# Patient Record
Sex: Female | Born: 1980 | Race: White | Hispanic: No | Marital: Married | Smoking: Current every day smoker
Health system: Southern US, Community
[De-identification: ages and names within clinical notes are randomized; demographics above are authoritative.]

## PROBLEM LIST (undated history)

## (undated) HISTORY — PX: COSMETIC SURGERY: SHX468

## (undated) HISTORY — PX: ABLATION: SHX5711

## (undated) HISTORY — PX: CHOLECYSTECTOMY: SHX55

---

## 2019-04-21 ENCOUNTER — Other Ambulatory Visit: Payer: Self-pay

## 2019-04-21 DIAGNOSIS — Z20822 Contact with and (suspected) exposure to covid-19: Secondary | ICD-10-CM

## 2019-04-23 LAB — NOVEL CORONAVIRUS, NAA: SARS-CoV-2, NAA: NOT DETECTED

## 2019-08-07 ENCOUNTER — Other Ambulatory Visit: Payer: Self-pay | Admitting: Obstetrics

## 2019-08-07 DIAGNOSIS — R928 Other abnormal and inconclusive findings on diagnostic imaging of breast: Secondary | ICD-10-CM

## 2019-08-14 ENCOUNTER — Ambulatory Visit
Admission: RE | Admit: 2019-08-14 | Discharge: 2019-08-14 | Disposition: A | Discharge status: Home / Self Care | Payer: Self-pay | Source: Ambulatory Visit | Attending: Obstetrics | Admitting: Obstetrics

## 2019-08-14 ENCOUNTER — Other Ambulatory Visit: Payer: Self-pay

## 2019-08-14 ENCOUNTER — Other Ambulatory Visit: Payer: Self-pay | Admitting: Obstetrics

## 2019-08-14 DIAGNOSIS — R928 Other abnormal and inconclusive findings on diagnostic imaging of breast: Secondary | ICD-10-CM

## 2019-08-21 ENCOUNTER — Other Ambulatory Visit: Payer: Self-pay | Admitting: Obstetrics

## 2019-08-21 DIAGNOSIS — N6001 Solitary cyst of right breast: Secondary | ICD-10-CM

## 2020-02-17 ENCOUNTER — Other Ambulatory Visit: Payer: Self-pay | Admitting: Obstetrics

## 2020-02-17 ENCOUNTER — Ambulatory Visit
Admission: RE | Admit: 2020-02-17 | Discharge: 2020-02-17 | Disposition: A | Discharge status: Home / Self Care | Payer: BC Managed Care – PPO | Source: Ambulatory Visit | Attending: Obstetrics | Admitting: Obstetrics

## 2020-02-17 ENCOUNTER — Other Ambulatory Visit: Payer: Self-pay

## 2020-02-17 ENCOUNTER — Ambulatory Visit
Admission: RE | Admit: 2020-02-17 | Discharge: 2020-02-17 | Disposition: A | Discharge status: Home / Self Care | Payer: Self-pay | Source: Ambulatory Visit | Attending: Obstetrics | Admitting: Obstetrics

## 2020-02-17 DIAGNOSIS — N6001 Solitary cyst of right breast: Secondary | ICD-10-CM

## 2020-08-11 ENCOUNTER — Ambulatory Visit (INDEPENDENT_AMBULATORY_CARE_PROVIDER_SITE_OTHER): Payer: BC Managed Care – PPO

## 2020-08-11 ENCOUNTER — Encounter: Payer: Self-pay | Admitting: Emergency Medicine

## 2020-08-11 ENCOUNTER — Ambulatory Visit
Admission: EM | Admit: 2020-08-11 | Discharge: 2020-08-11 | Disposition: A | Discharge status: Home / Self Care | Payer: BC Managed Care – PPO | Attending: Emergency Medicine | Admitting: Emergency Medicine

## 2020-08-11 ENCOUNTER — Other Ambulatory Visit: Payer: Self-pay

## 2020-08-11 DIAGNOSIS — R059 Cough, unspecified: Secondary | ICD-10-CM | POA: Diagnosis not present

## 2020-08-11 DIAGNOSIS — Z720 Tobacco use: Secondary | ICD-10-CM

## 2020-08-11 DIAGNOSIS — R062 Wheezing: Secondary | ICD-10-CM

## 2020-08-11 DIAGNOSIS — T7840XA Allergy, unspecified, initial encounter: Secondary | ICD-10-CM

## 2020-08-11 DIAGNOSIS — J209 Acute bronchitis, unspecified: Secondary | ICD-10-CM

## 2020-08-11 MED ORDER — ALBUTEROL SULFATE HFA 108 (90 BASE) MCG/ACT IN AERS: 1.0000 | INHALATION_SPRAY | Freq: Four times a day (QID) | RESPIRATORY_TRACT | 0 refills | Status: AC | PRN

## 2020-08-11 MED ORDER — BENZONATATE 100 MG PO CAPS: 100.0000 mg | ORAL_CAPSULE | Freq: Three times a day (TID) | ORAL | 0 refills | Status: AC

## 2020-08-11 MED ORDER — PREDNISONE 10 MG (21) PO TBPK: ORAL_TABLET | Freq: Every day | ORAL | 0 refills | Status: AC

## 2020-08-11 NOTE — ED Triage Notes (Signed)
Since Sunday has been sneezing and having seasonal allergies.  Started taking zyrtec yesterday.  Nyquil at night. Now pt has started coughing with no relief from OTC medication.

## 2020-08-11 NOTE — Discharge Instructions (Signed)
Get plenty of rest and push fluids Prescribed tessolone perles as needed for cough Prednisone prescribed.  Take as directed and to completion Use OTC medication as needed for symptomatic relief Follow up with PCP for recheck and/or if symptoms persists Return or go to ER if you have any new or worsening symptoms such as fever, chills, fatigue, shortness of breath, wheezing, chest pain, nausea, changes in bowel or bladder habits, etc..Marland Kitchen

## 2020-08-11 NOTE — ED Provider Notes (Signed)
[facility]   [id number] 08/11/20 Arrival Time: 1510  Cc: COUGH  SUBJECTIVE:  Whitney Petersen is a 40 y.o. female who presents with sneezing, sinus congestion/ pressure, nonproductive cough, and wheezing x 3 days.  Son with sinus infection.  Has tried OTC medications without relief.  Symptoms are made worse with laying down.  Reports previous symptoms in the past with bronchitis.   Denies fever, chest pain, nausea, changes in bowel or bladder habits.    ROS: As per HPI.  All other pertinent ROS negative.     History reviewed. No pertinent past medical history. Past Surgical History:  Procedure Laterality Date  . ABLATION    . CHOLECYSTECTOMY    . COSMETIC SURGERY     breast surgery   No Known Allergies No current facility-administered medications on file prior to encounter.   No current outpatient medications on file prior to encounter.    Social History   Socioeconomic History  . Marital status: Married    Spouse name: Not on file  . Number of children: Not on file  . Years of education: Not on file  . Highest education level: Not on file  Occupational History  . Not on file  Tobacco Use  . Smoking status: Current Every Day Smoker  . Smokeless tobacco: Never Used  Substance and Sexual Activity  . Alcohol use: Yes  . Drug use: Never  . Sexual activity: Not on file  Other Topics Concern  . Not on file  Social History Narrative  . Not on file   Social Determinants of Health   Financial Resource Strain: Not on file  Food Insecurity: Not on file  Transportation Needs: Not on file  Physical Activity: Not on file  Stress: Not on file  Social Connections: Not on file  Intimate Partner Violence: Not on file   Family History  Problem Relation Age of Onset  . Breast cancer Maternal Grandmother      OBJECTIVE:  Vitals:   08/11/20 1526 08/11/20 1527  BP: 111/66   Pulse: 76   Resp: 20   Temp: 98.5 F (36.9 C)   SpO2: 94%   Weight:  160 lb  (72.6 kg)     General appearance: Alert, appears fatigued, but nontoxic; speaking in full sentences without difficulty HEENT:NCAT; Ears: EACs clear, TMs pearly gray; Eyes: PERRL.  EOM grossly intact. Nose: nares patent without rhinorrhea, nares mildly erythematous; Throat: tonsils nonerythematous or enlarged, uvula midline  Neck: supple without LAD Lungs: wheezes heard throughout bilateral lung fields; normal respiratory effort; mild cough present Heart: regular rate and rhythm.   Skin: warm and dry Psychological: alert and cooperative; normal mood and affect  DIAGNOSTIC STUDIES:  DG Chest 2 View  Result Date: 08/11/2020 CLINICAL DATA:  Cough, allergies, tobacco abuse EXAM: CHEST - 2 VIEW COMPARISON:  None. FINDINGS: Frontal and lateral views of the chest demonstrate an unremarkable cardiac silhouette. No acute airspace disease, effusion, or pneumothorax. Interstitial prominence throughout the lungs may be related to history of tobacco abuse. No acute bony abnormalities. IMPRESSION: 1. No acute intrathoracic process. Electronically Signed   By: Sharlet Salina M.D.   On: 08/11/2020 16:01    I have reviewed the x-rays myself and the radiologist interpretation. I am in agreement with the radiologist interpretation.     ASSESSMENT & PLAN:  1. Cough   2. Acute bronchitis, unspecified organism   3. Wheezing     Meds ordered this encounter  Medications  . benzonatate (TESSALON) 100  MG capsule    Sig: Take 1 capsule (100 mg total) by mouth every 8 (eight) hours.    Dispense:  21 capsule    Refill:  0    Order Specific Question:   Supervising Provider    Answer:   Eustace Moore [[id number]]  . predniSONE (STERAPRED UNI-PAK 21 TAB) 10 MG (21) TBPK tablet    Sig: Take by mouth daily. Take 6 tabs by mouth daily  for 2 days, then 5 tabs for 2 days, then 4 tabs for 2 days, then 3 tabs for 2 days, 2 tabs for 2 days, then 1 tab by mouth daily for 2 days    Dispense:  42 tablet    Refill:  0     Order Specific Question:   Supervising Provider    Answer:   Eustace Moore [[id number]]  . albuterol (VENTOLIN HFA) 108 (90 Base) MCG/ACT inhaler    Sig: Inhale 1-2 puffs into the lungs every 6 (six) hours as needed for wheezing or shortness of breath.    Dispense:  18 g    Refill:  0    Order Specific Question:   Supervising Provider    Answer:   Eustace Moore [[id number]]    Orders Placed This Encounter  Procedures  . DG Chest 2 View    Standing Status:   Standing    Number of Occurrences:   1    Order Specific Question:   Reason for Exam (SYMPTOM  OR DIAGNOSIS REQUIRED)    Answer:   cough    Albuterol inhaler prescribed.  Use as directed Get plenty of rest and push fluids Prescribed tessolone perles as needed for cough Prednisone prescribed.  Take as directed and to completion Use OTC medication as needed for symptomatic relief Follow up with PCP for recheck and/or if symptoms persists Return or go to ER if you have any new or worsening symptoms such as fever, chills, fatigue, shortness of breath, wheezing, chest pain, nausea, changes in bowel or bladder habits, etc...  Reviewed expectations re: course of current medical issues. Questions answered. Outlined signs and symptoms indicating need for more acute intervention. Patient verbalized understanding. After Visit Summary given.          Rennis Harding, PA-C 08/11/20 1609

## 2020-08-17 ENCOUNTER — Other Ambulatory Visit: Payer: Self-pay

## 2020-08-17 ENCOUNTER — Ambulatory Visit
Admission: RE | Admit: 2020-08-17 | Discharge: 2020-08-17 | Disposition: A | Discharge status: Home / Self Care | Payer: BC Managed Care – PPO | Source: Ambulatory Visit | Attending: Obstetrics | Admitting: Obstetrics

## 2020-08-17 DIAGNOSIS — N6001 Solitary cyst of right breast: Secondary | ICD-10-CM

## 2022-05-28 IMAGING — US US BREAST*R* LIMITED INC AXILLA
1 series · 6 of 6 positions shown · non-contrast
Comparison: Previous exam(s).

CLINICAL DATA: Short-term follow-up for a probably benign mass in
the right breast, initially assessed on 08/14/2019.

EXAM:
DIGITAL DIAGNOSTIC RIGHT MAMMOGRAM WITH IMPLANTS AND TOMO
ULTRASOUND RIGHT BREAST
The patient has retropectoral implants. Standard and implant
displaced views were performed.

[Series 1: us breast*right* limited inc axilla · 0.06mm/px · 6 of 6 slices shown]
[im 1/6]
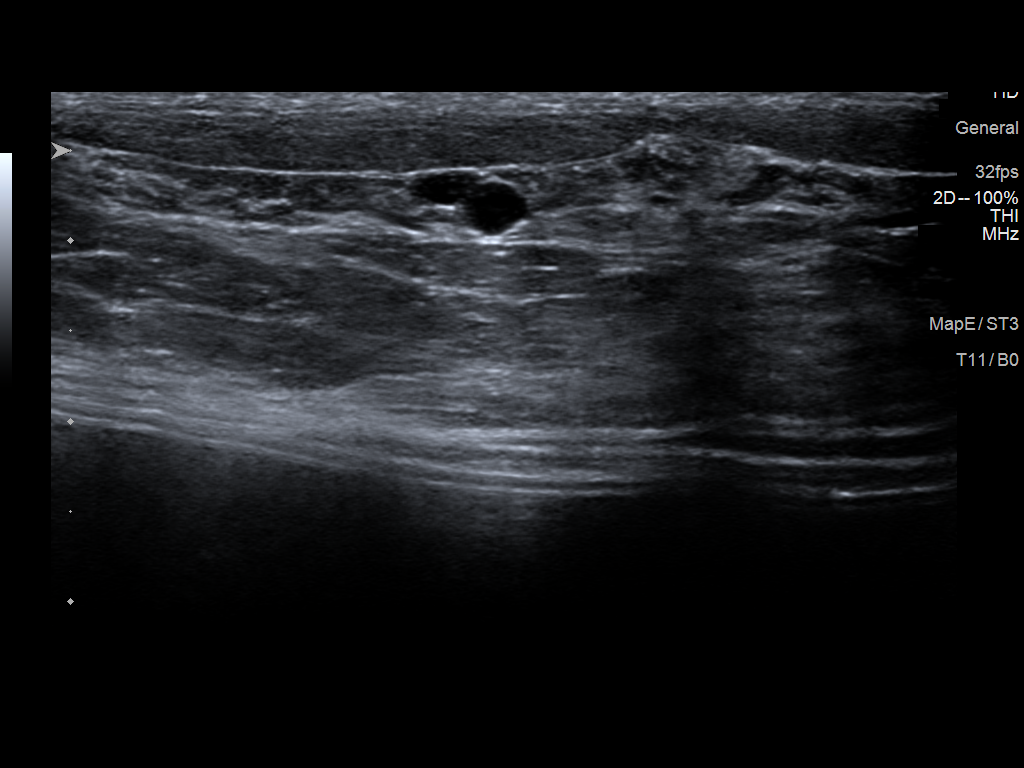
[im 2/6]
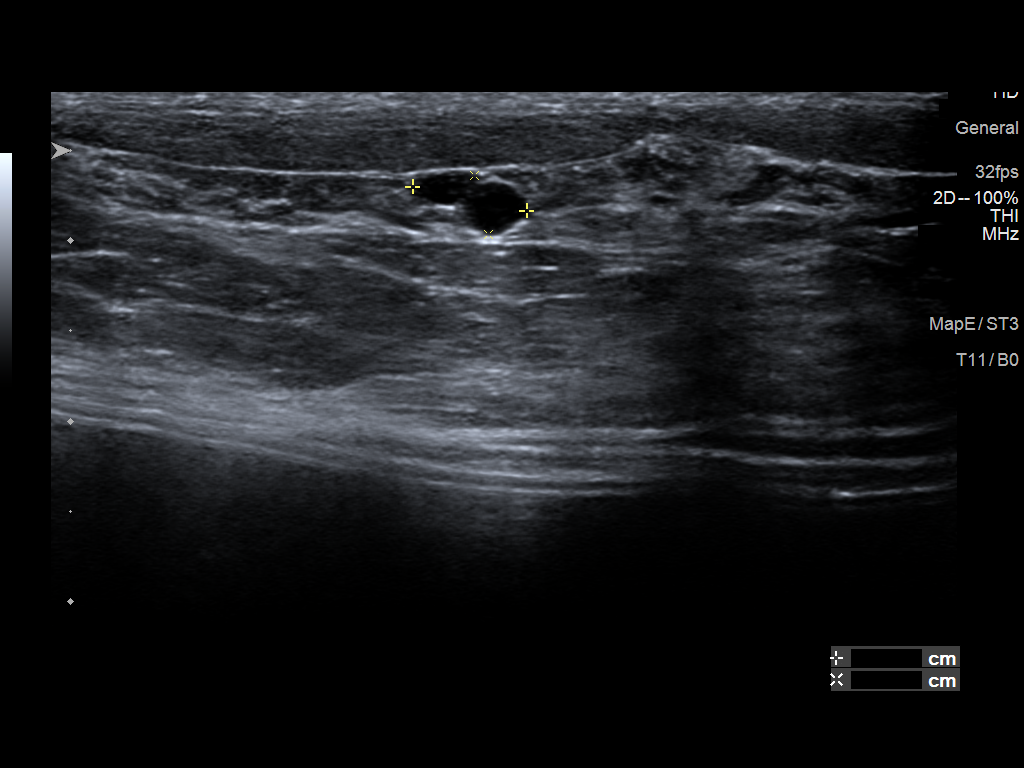
[im 3/6]
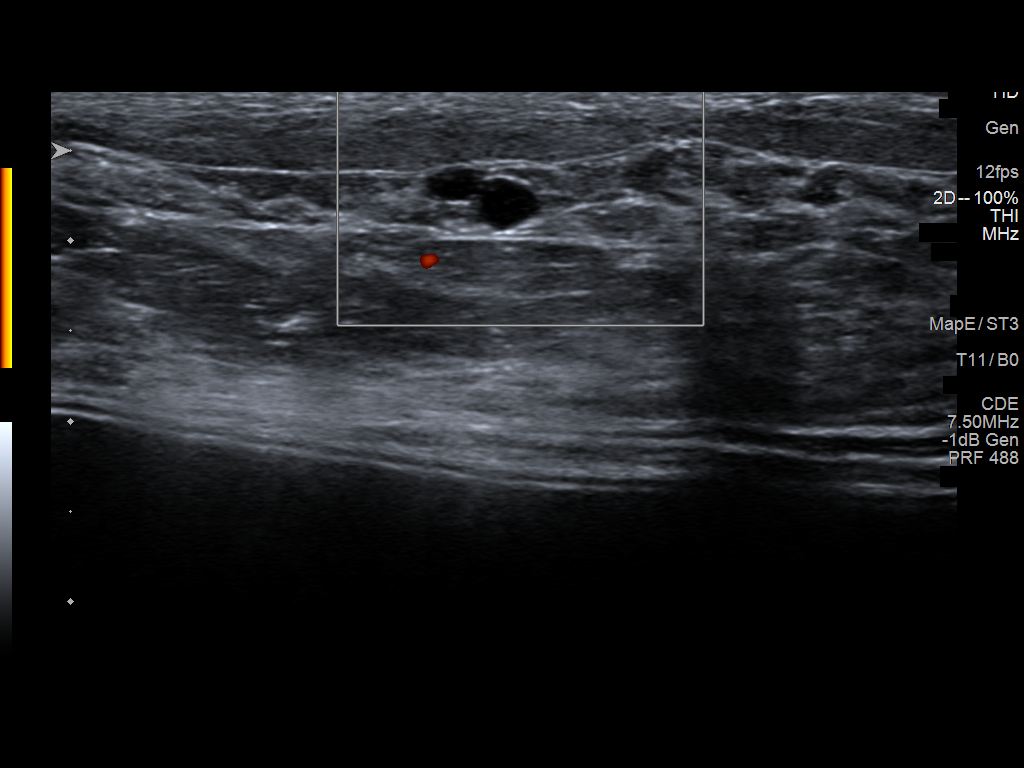
[im 4/6]
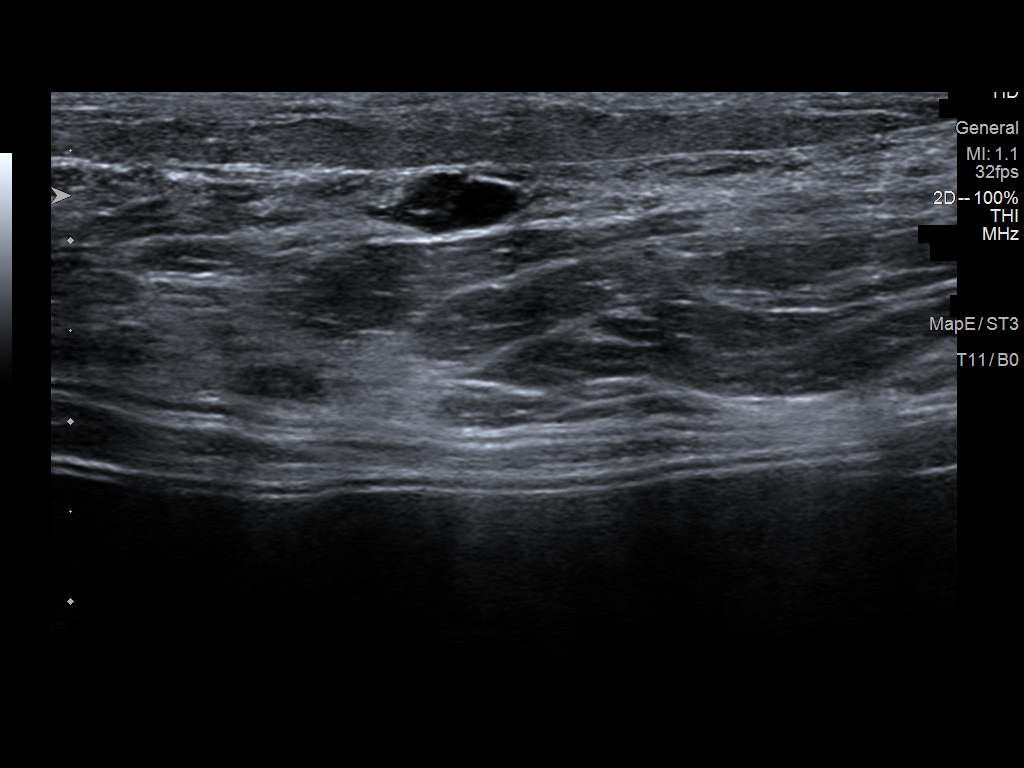
[im 5/6]
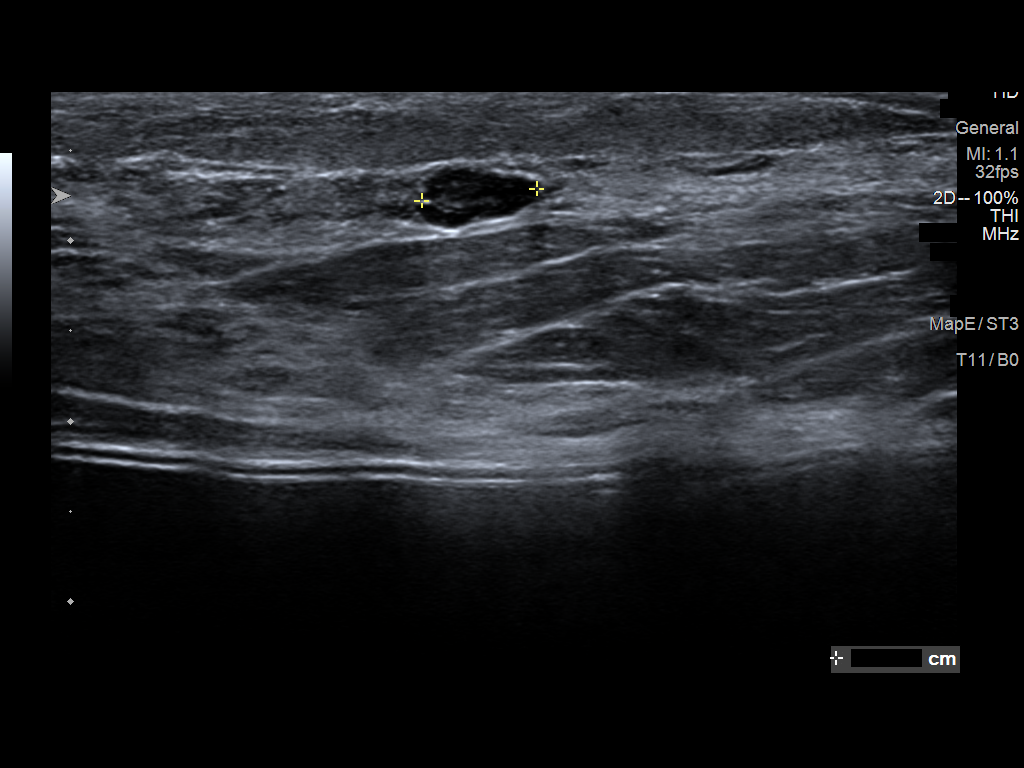
[im 6/6]
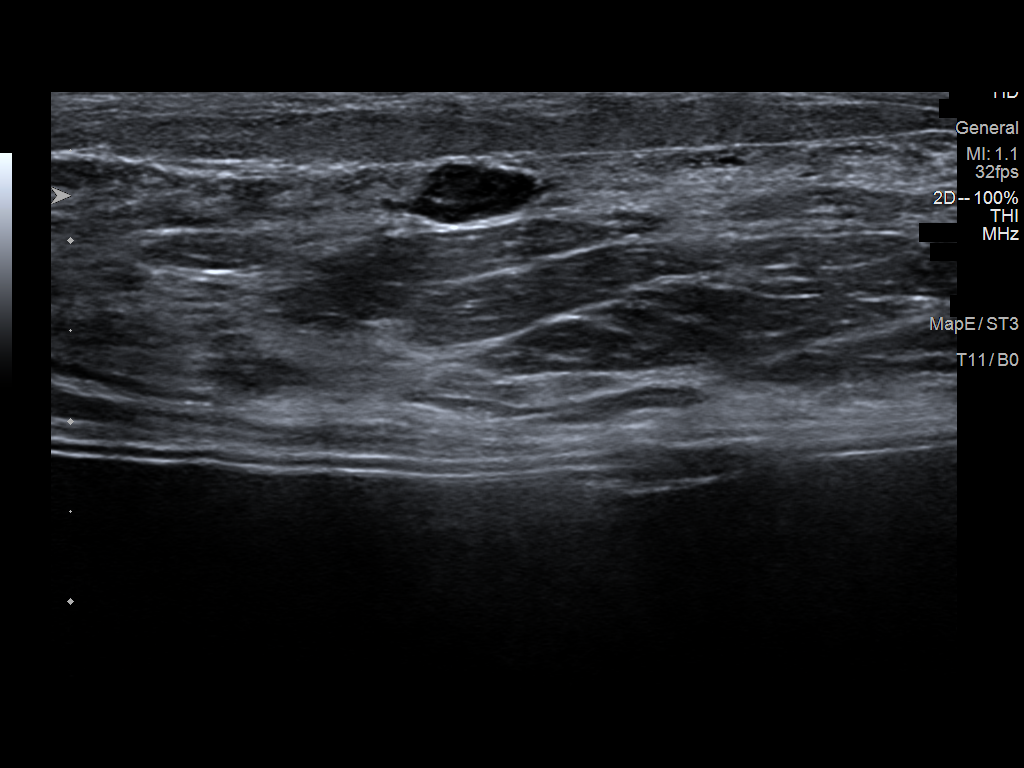

[6 of 6 positions shown; findings below may reference images not displayed]

ACR Breast Density Category c: The breast tissue is heterogeneously
dense, which may obscure small masses.
FINDINGS: There is no defined mass. Small of asymmetry noted in the upper
right breast on the prior exam is unchanged. There are no areas of
architectural distortion and there are no suspicious calcifications.

Targeted ultrasound is performed, showing a small cystic mass in the
right breast at 11:30 o'clock, 4 cm the nipple, measuring 7 x 3 x 6
mm, unchanged from the prior exam allowing for slight measurement
technique differences.
IMPRESSION: 1. Probably benign small mass in the upper right breast, consistent
with a complicated cyst, stable for 6 months. Additional short-term
follow-up recommended.

RECOMMENDATION:
Diagnostic mammography with right breast ultrasound in 6 months.

I have discussed the findings and recommendations with the patient.
If applicable, a reminder letter will be sent to the patient
regarding the next appointment.

BI-RADS CATEGORY  3: Probably benign.

## 2022-11-20 IMAGING — DX DG CHEST 2V
2 series · 2 of 2 positions shown · non-contrast
Comparison: None.

CLINICAL DATA: Cough, allergies, tobacco abuse

EXAM:
CHEST - 2 VIEW

[chest pa]
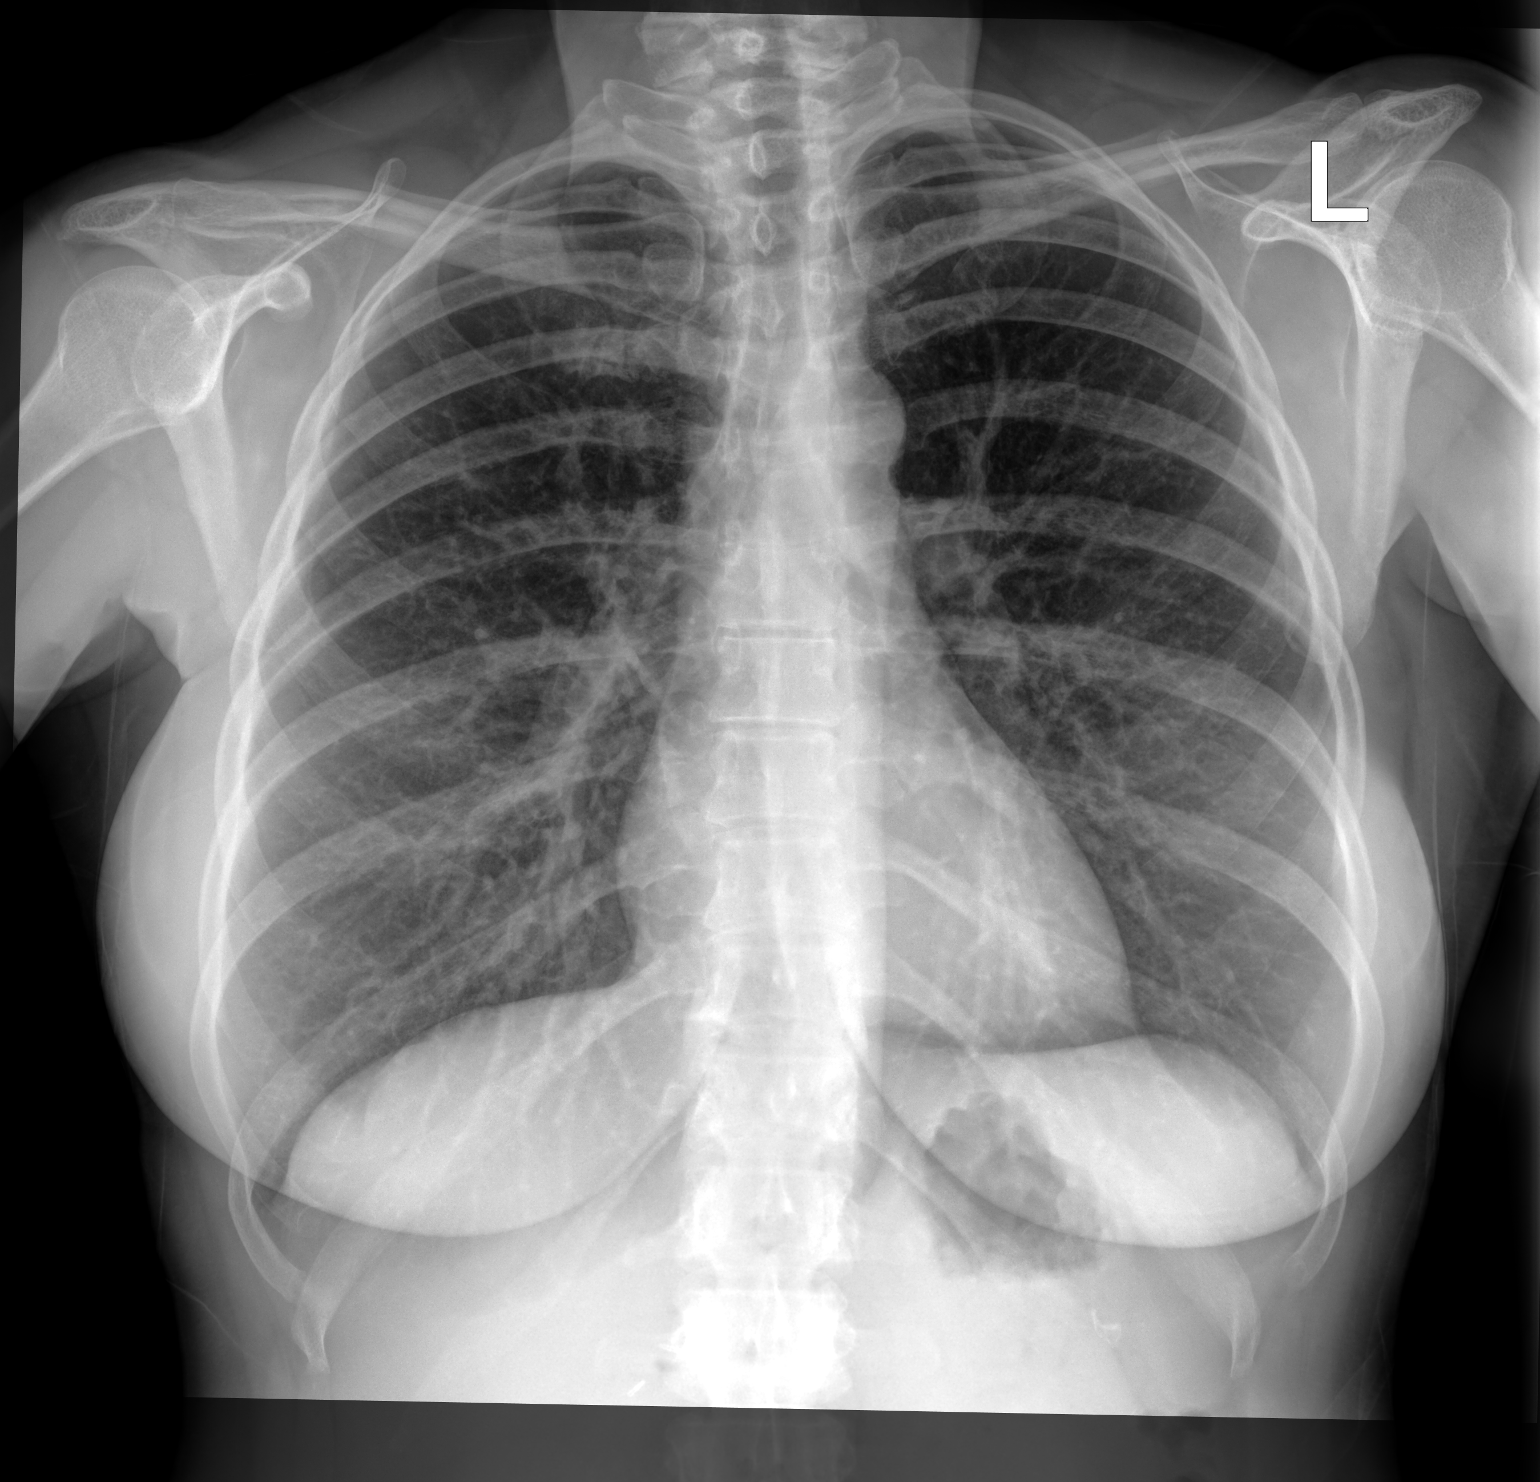

[chest lat]
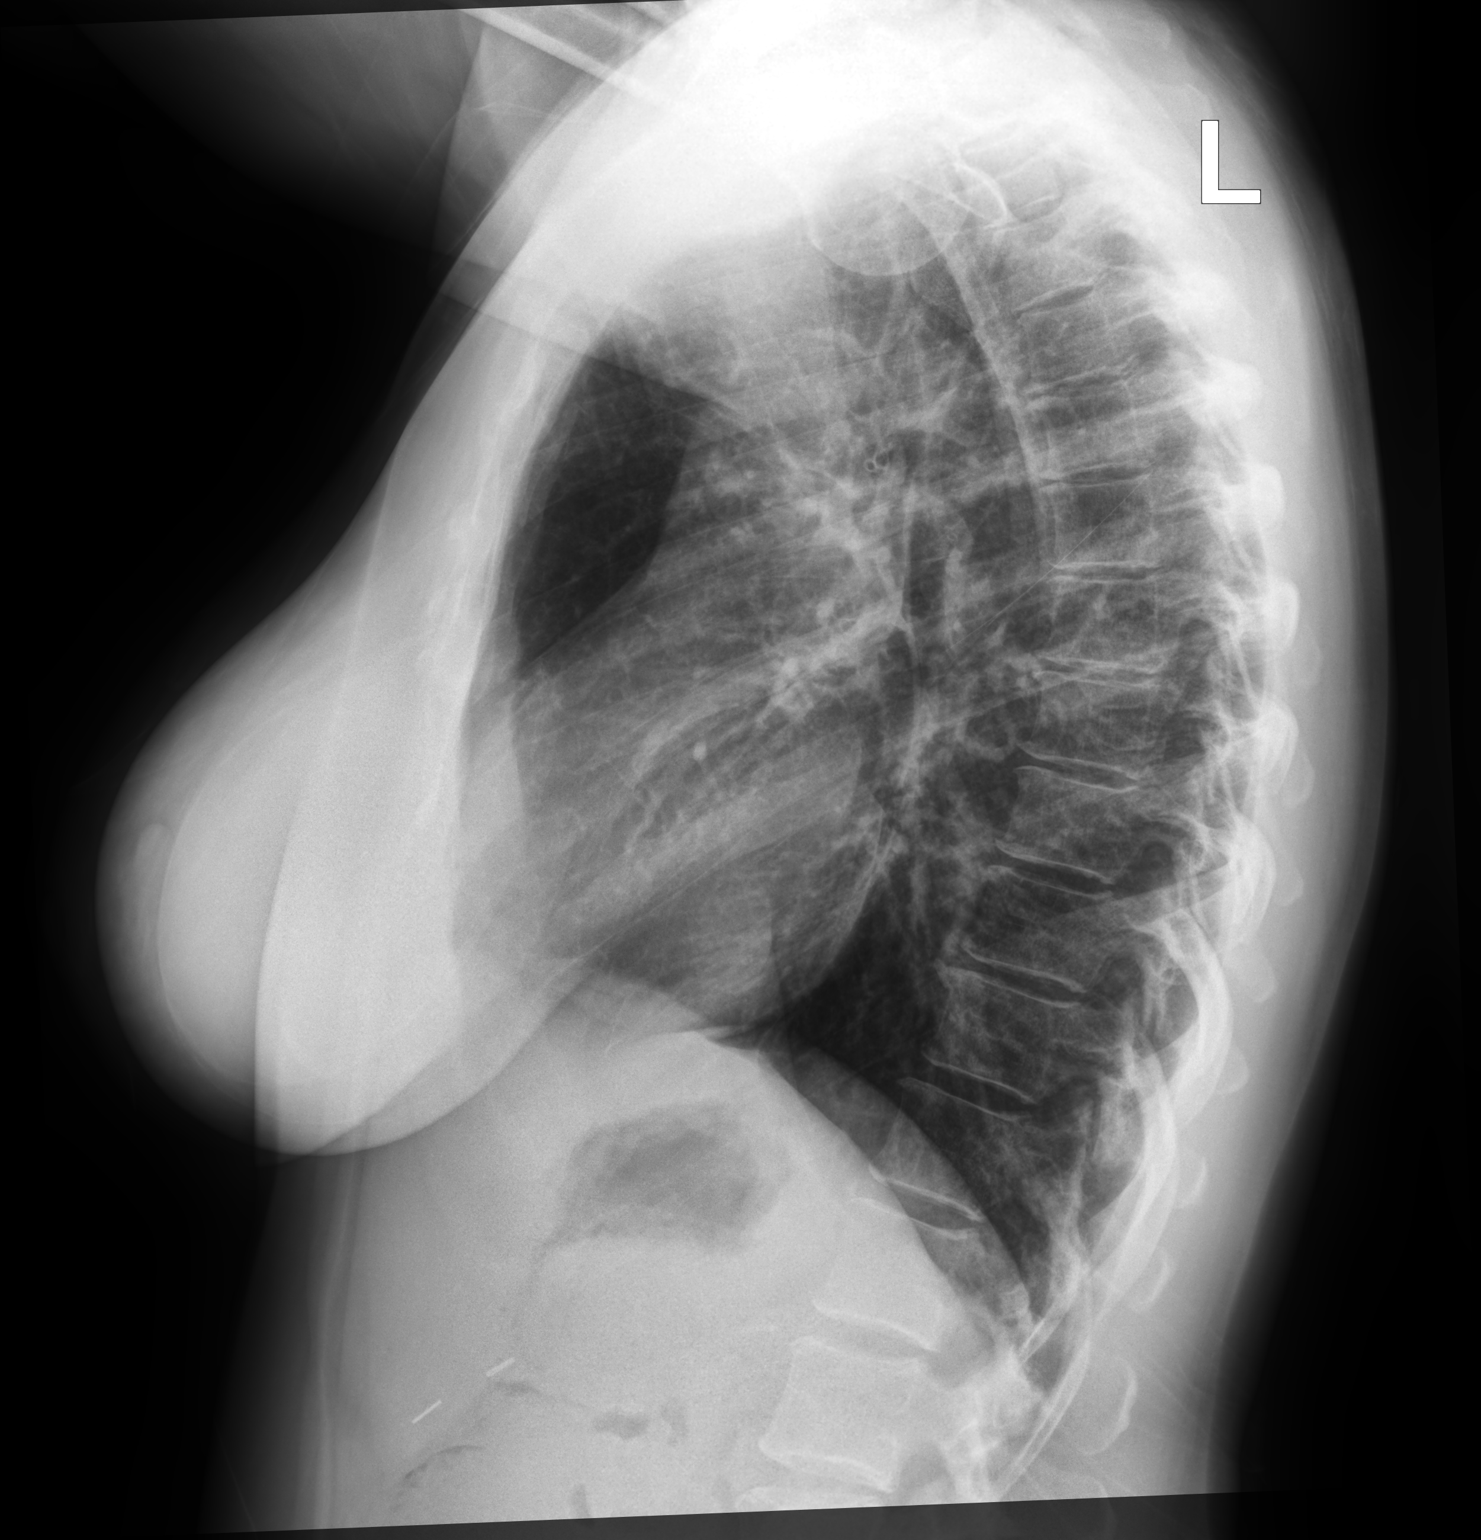

[2 of 2 positions shown; findings below may reference images not displayed]

FINDINGS: Frontal and lateral views of the chest demonstrate an unremarkable
cardiac silhouette. No acute airspace disease, effusion, or
pneumothorax. Interstitial prominence throughout the lungs may be
related to history of tobacco abuse. No acute bony abnormalities.
IMPRESSION: 1. No acute intrathoracic process.

## 2022-11-26 IMAGING — US US BREAST*R* LIMITED INC AXILLA
1 series · 5 of 5 positions shown · non-contrast
Comparison: Previous exam(s).

CLINICAL DATA: 40-year-old female presenting for annual exam as
well as 1 year follow-up of a probably benign right breast mass.

EXAM:
DIGITAL DIAGNOSTIC BILATERAL MAMMOGRAM WITH IMPLANTS, CAD AND
TOMOSYNTHESIS; ULTRASOUND RIGHT BREAST LIMITED
TECHNIQUE: Bilateral digital diagnostic mammography and breast tomosynthesis
was performed. The images were evaluated with computer-aided
detection. Standard and/or implant displaced views were performed.;
Targeted ultrasound examination of the right breast was performed

[Series 1: us breast*right* limited inc axilla · 0.06mm/px · 5 of 5 slices shown]
[im 1/5]
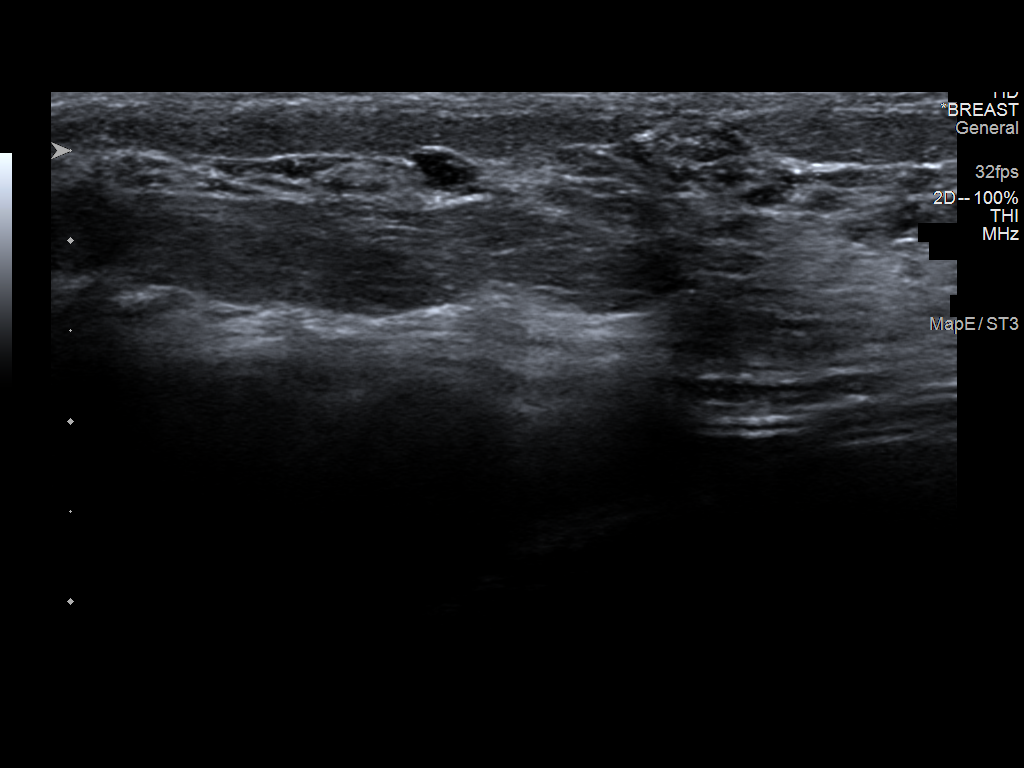
[im 2/5]
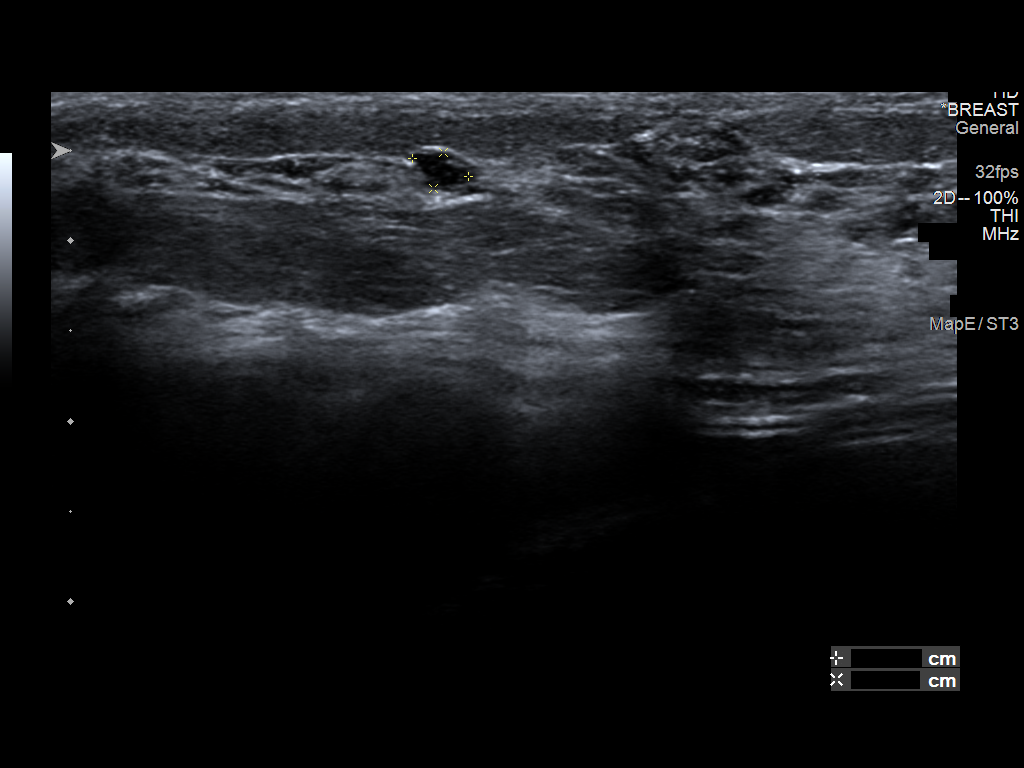
[im 3/5]
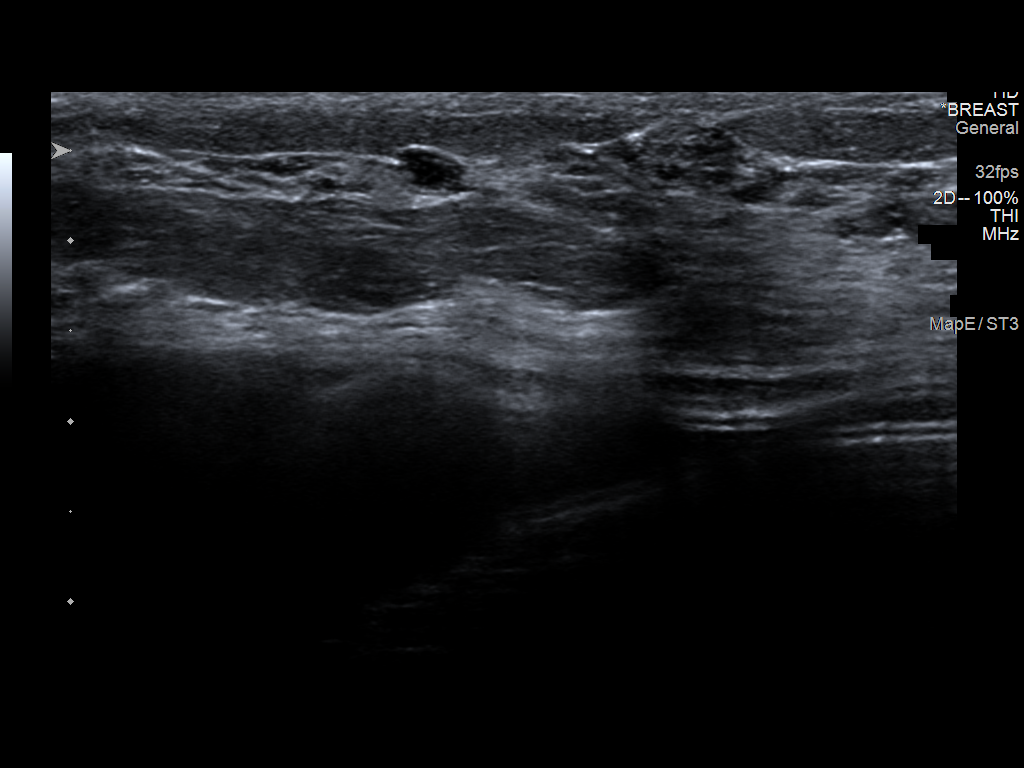
[im 4/5]
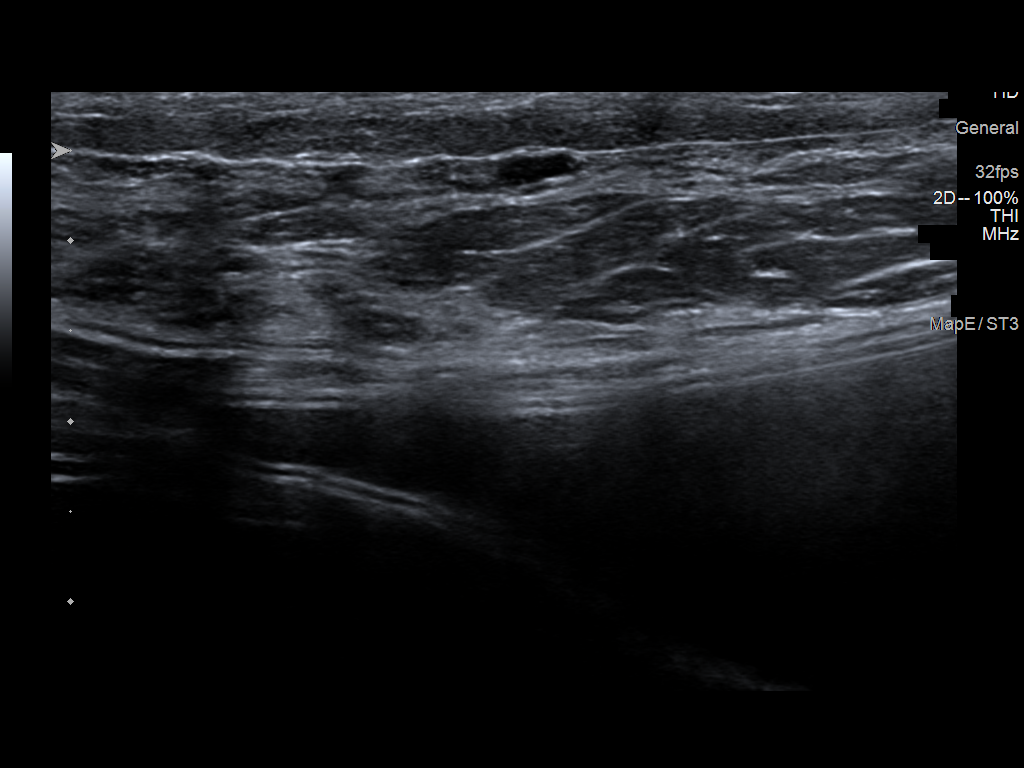
[im 5/5]
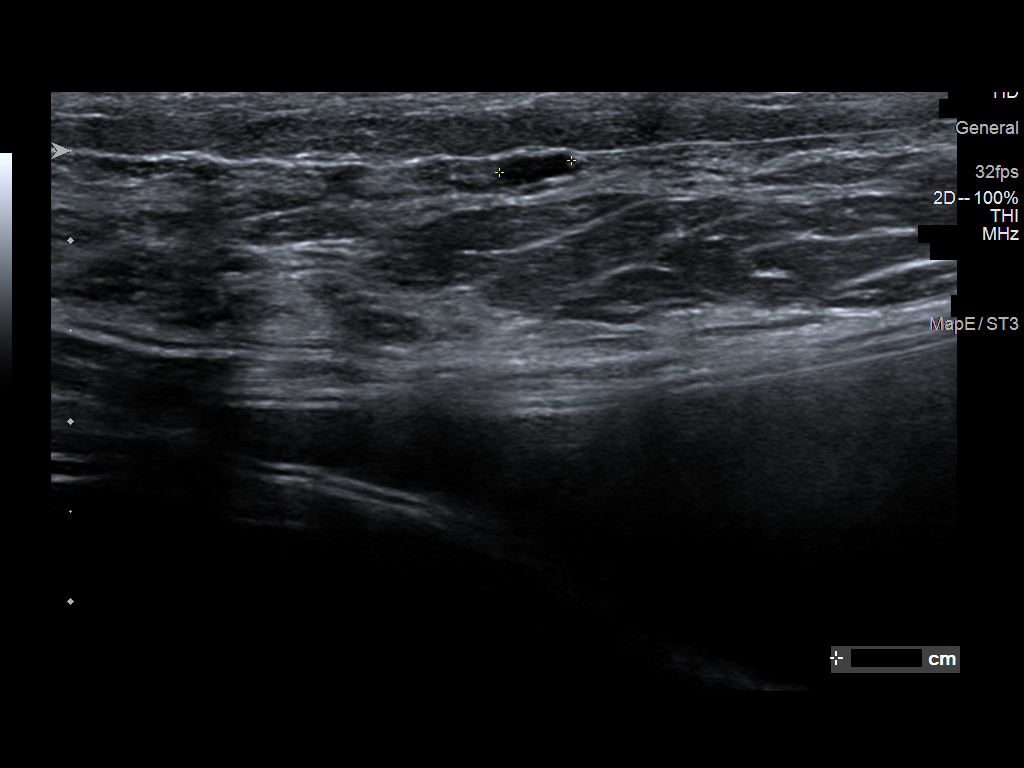

[5 of 5 positions shown; findings below may reference images not displayed]

ACR Breast Density Category c: The breast tissue is heterogeneously
dense, which may obscure small masses.
FINDINGS: Mammogram:

The patient has retroglandular implants.

Right breast: No suspicious mass, distortion, or microcalcifications
are identified to suggest presence of malignancy. The previously
seen asymmetry in the upper right breast is less conspicuous today
with the appearance of normal fibroglandular tissue.

Left breast: No suspicious mass, distortion, or microcalcifications
are identified to suggest presence of malignancy.

Ultrasound:

Targeted ultrasound performed in the right breast at 11:30 o'clock 4
cm from the nipple demonstrating an oval circumscribed hypoechoic
mass measuring 0.3 x 0.2 x 0.4 cm, previously measuring 0.7 x 0.3 x
0.6 cm. This likely represents a complicated cyst.
IMPRESSION: 1. Interval decrease in size of a right breast mass at 11:30
o'clock, likely a complicated cyst. Given decrease in size, this is
considered benign.

2.  No mammographic evidence of malignancy in the left breast.

RECOMMENDATION:
Screening mammogram in one year.(Code:[id number]-[id number])

I have discussed the findings and recommendations with the patient.
If applicable, a reminder letter will be sent to the patient
regarding the next appointment.

BI-RADS CATEGORY  2: Benign.
